# Patient Record
Sex: Female | Born: 1950 | Race: White | Hispanic: No | Marital: Married | Smoking: Never smoker
Health system: Southern US, Community
[De-identification: ages and names within clinical notes are randomized; demographics above are authoritative.]

## PROBLEM LIST (undated history)

## (undated) HISTORY — PX: TUBAL LIGATION: SHX77

## (undated) HISTORY — PX: TIBIA FRACTURE SURGERY: SHX806

---

## 2011-08-15 ENCOUNTER — Inpatient Hospital Stay: Payer: Self-pay | Admitting: Specialist

## 2011-08-15 LAB — CBC
HCT: 41.6 % (ref 35.0–47.0)
HGB: 14.7 g/dL (ref 12.0–16.0)
MCHC: 35.4 g/dL (ref 32.0–36.0)
MCV: 89 fL (ref 80–100)
RBC: 4.67 10*6/uL (ref 3.80–5.20)
RDW: 12.2 % (ref 11.5–14.5)
WBC: 10.8 10*3/uL (ref 3.6–11.0)

## 2011-08-15 LAB — COMPREHENSIVE METABOLIC PANEL
Alkaline Phosphatase: 48 U/L — ABNORMAL LOW (ref 50–136)
BUN: 17 mg/dL (ref 7–18)
Bilirubin,Total: 0.8 mg/dL (ref 0.2–1.0)
Chloride: 107 mmol/L (ref 98–107)
EGFR (African American): >60
Glucose: 132 mg/dL — ABNORMAL HIGH (ref 65–99)
Potassium: 4 mmol/L (ref 3.5–5.1)
SGOT(AST): 24 U/L (ref 15–37)
SGPT (ALT): 28 U/L
Sodium: 143 mmol/L (ref 136–145)
Total Protein: 6.8 g/dL (ref 6.4–8.2)

## 2011-08-15 LAB — PROTIME-INR: Prothrombin Time: 11.9 secs (ref 11.5–14.7)

## 2013-07-22 IMAGING — CR PELVIS - 1-2 VIEW
1 series · 1 of 1 positions shown · non-contrast
Comparison: none

REASON FOR EXAM: pain following trauma
COMMENTS:

[t pelvis ap]
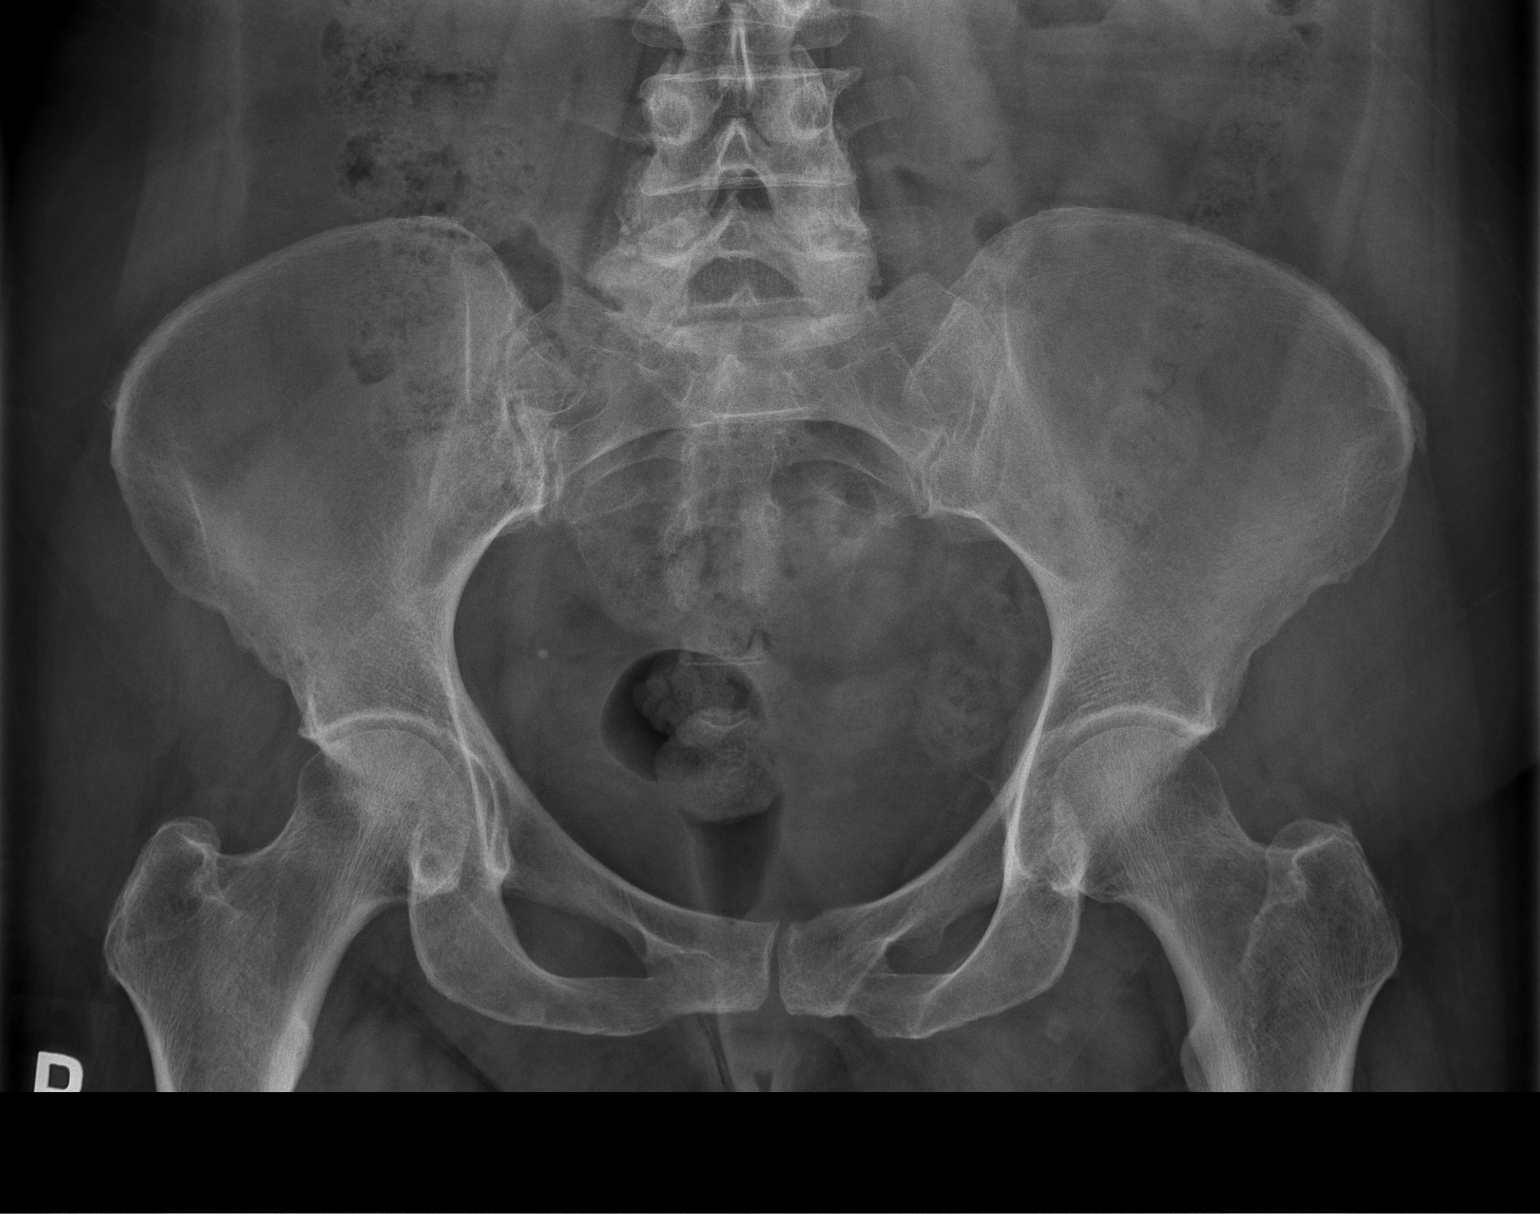

[1 of 1 positions shown; findings below may reference images not displayed]

PROCEDURE:     DXR - DXR PELVIS AP ONLY  - August 15, 2011 [DATE]

RESULT:

An AP view of the bony pelvis shows no fracture or other significant osseous
abnormality. No lytic or blastic lesions are seen. The hip joint spaces
bilaterally are symmetrical. The sacroiliac joints are normal in appearance.
IMPRESSION: No acute changes are identified.

## 2013-07-22 IMAGING — CR DG CHEST 1V
1 series · 1 of 1 positions shown · non-contrast
Comparison: none

REASON FOR EXAM: pain following trauma
COMMENTS:

PROCEDURE:     DXR - DXR CHEST 1 VIEWAP OR PA  - August 15, 2011 [DATE]
RESULT:
The lung fields are clear. The heart, mediastinal and osseous structures
reveal no significant abnormalities.

[t chest supine]
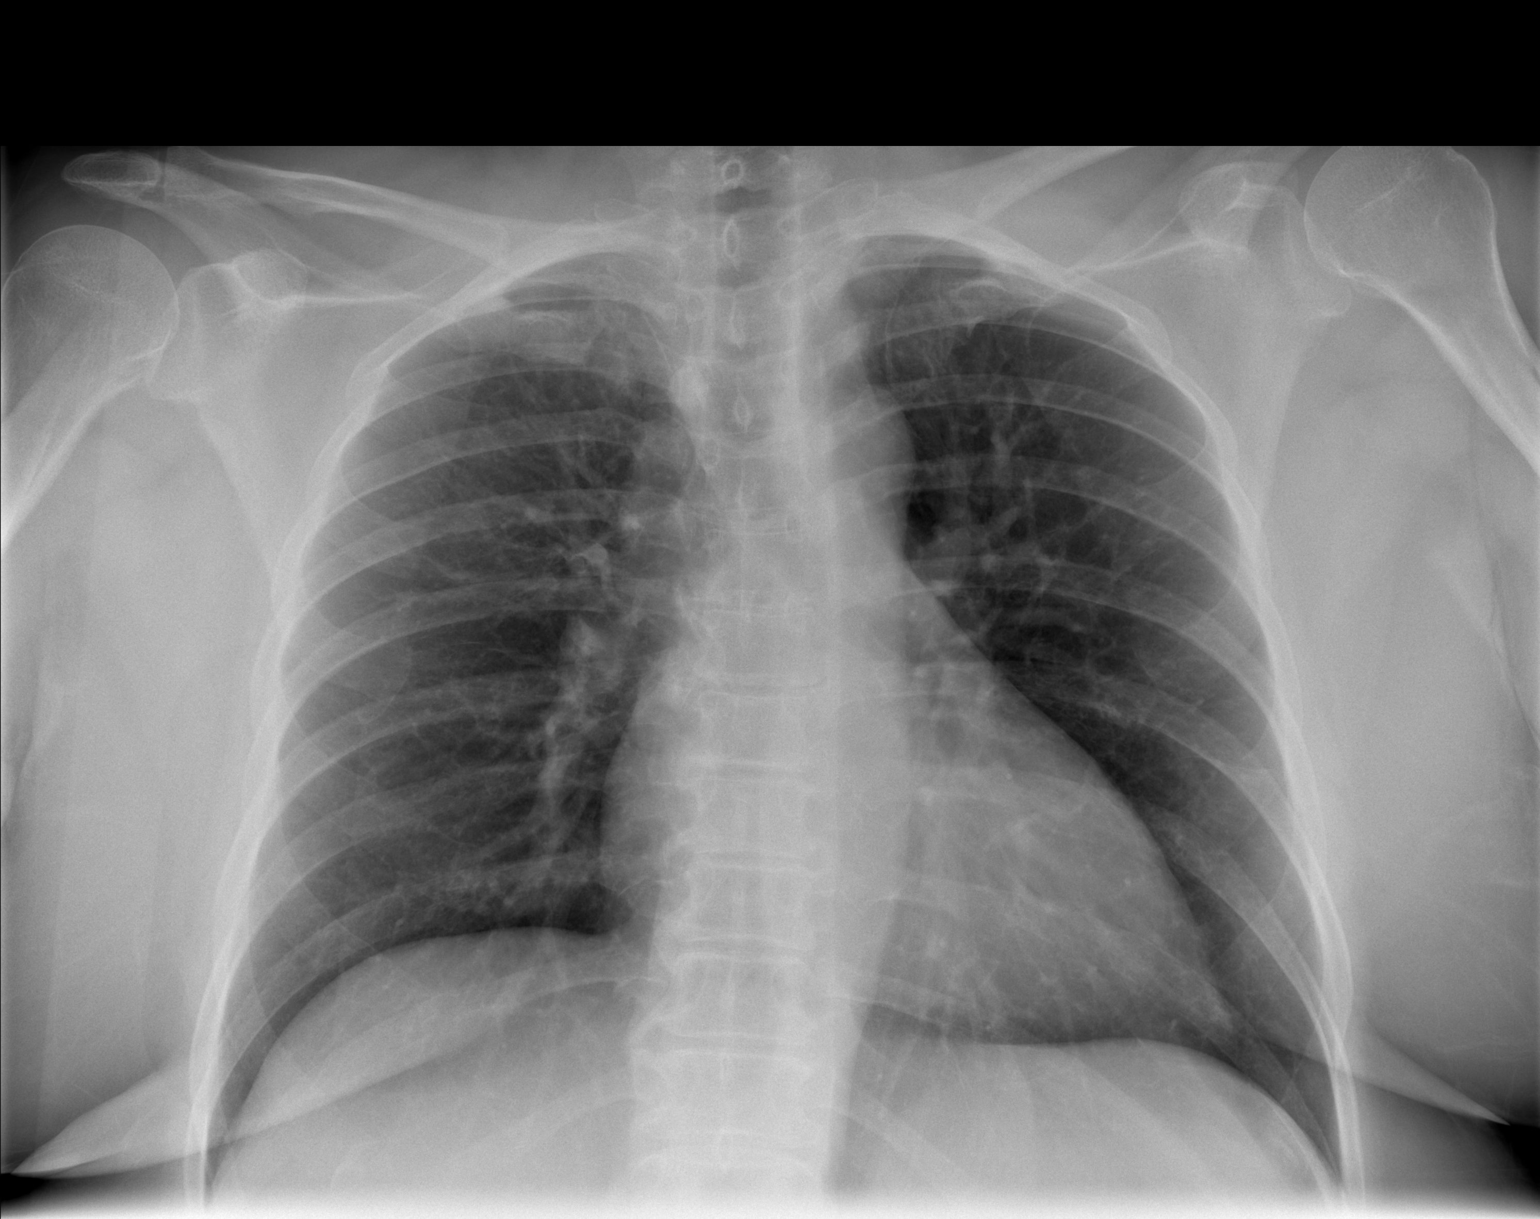

[1 of 1 positions shown; findings below may reference images not displayed]

IMPRESSION: No acute changes are identified.

## 2014-12-03 NOTE — Op Note (Signed)
PATIENT NAME:  Jodi LaineHOMPSON, Laporsha MR#:  [id number] DATE OF BIRTH:  April 19, 1951  DATE OF PROCEDURE:  08/16/2011  PREOPERATIVE DIAGNOSIS: Closed left tibial shaft fracture.   POSTOPERATIVE DIAGNOSIS: Closed left tibial shaft fracture.   PROCEDURE: Intramedullary rodding left tibial shaft fracture.   SURGEON: Clare Gandyhristopher E. Remmy Riffe, MD  ANESTHESIA: Spinal.   COMPLICATIONS: None.   ESTIMATED BLOOD LOSS: 100 mL.   DESCRIPTION OF PROCEDURE: After adequate induction of spinal anesthesia, 1 gram of Ancef was given intravenously. The patient was positioned in the supine position on the Operating Room table. The left lower extremity was completely prepped out from the toes to the groin and draped in standard sterile fashion. No tourniquet was used. The tibial traction device was placed beneath the patient's left knee and the foot was secured in the foot device using sterile Coban. Rotationally the tibia was adjusted to the rotation of the opposite side. Standard longitudinal incision is made over the medial aspect of the patellar tendon and the dissection carried down to the tibia. Guidepin is inserted into the proximal tibia anteriorly just medial to the patellar tendon. The reamer was used over this and then a ball-tipped guidepin was inserted and passed down to the ankle joint and reduction and position of the pin are confirmed in the AP and lateral views using the FluoroScan. An 8 mm 30 cm DePuy Versa nail was then passed over the guidepin and impacted all the way down to the tip. The reduction and the position of the rod was checked in the AP and lateral views and was seen to be satisfactory. Guide pin is removed. The proximal screw is inserted obliquely from the medial side using the guide and this is a 5.5 mm screw. Distally the rod is locked freehand using a 4.5 cortical screw. The position of the fracture, the rod and the interlocking screws is checked finally with the FluoroScan and is seen to be  satisfactory. The wounds were thoroughly irrigated multiple times with normal saline. Subcutaneous tissue is closed at the knee with 3-0 Vicryl and the skin is closed with the skin stapler. The interlocking screw portals are closed with the skin stapler. Soft bulky dressing is applied along with a sugar tong type splint and the patient is returned to recovery room in satisfactory condition having tolerated the procedure quite well.   ____________________________ Clare Gandyhristopher E. Natane Heward, MD ces:cms D: 08/16/2011 13:18:32 ET T: 08/16/2011 14:31:31 ET JOB#: [id number]  cc: Clare Gandyhristopher E. Cathlene Gardella, MD, <Dictator> Clare GandyHRISTOPHER E Areil Ottey MD ELECTRONICALLY SIGNED 08/26/2011 13:33

## 2014-12-03 NOTE — Discharge Summary (Signed)
PATIENT NAME:  Jodi Osborn, Jodi Osborn MR#:  [id number] DATE OF BIRTH:  07/29/1951  DATE OF ADMISSION:  08/15/2011 DATE OF DISCHARGE:  08/18/2011  DISCHARGE DIAGNOSIS: Displaced midshaft left tibia fracture.   OPERATIONS/PROCEDURES PERFORMED: Intramedullary nailing left tibial shaft fracture on 08/16/2011.   PHYSICAL EXAMINATION: As written on admission.   LABORATORY DATA: As noted in the chart.   HOSPITAL COURSE: The patient was admitted in a posterior splint and her neurovascular examination remained intact. Her lab data is within normal limits and she was fully healthy. On 08/16/2011, she was taken to the operating room where intramedullary nailing of her left tibia fracture was performed without difficulty. Postoperatively she had a benign course. She was advanced up into the chair with physical therapy using the walker, toe-touch weightbearing. By 08/18/2011, she was doing quite well and she was discharged home with home health physical therapy.   DISCHARGE INSTRUCTIONS: 1. She is to resume taking any of her previous medications.  2. She is to take Vicodin 1 tablet every three hours as necessary for pain.  3. She is to keep her leg elevated as much as possible at all times.  4. She is to take aspirin 1 tablet per day as prophylaxis for venous thrombosis and pulmonary embolism.  5. She is to return to the office to see Dr. Katrinka BlazingSmith on 08/24/2011 at 10:30 a.m. for x-ray and physical examination.   ____________________________ Clare Gandyhristopher E. Jyla Hopf, MD ces:drc D: 09/18/2011 13:53:28 ET T: 09/18/2011 14:01:56 ET JOB#: [id number]  cc: Clare Gandyhristopher E. Gyanna Jarema, MD, <Dictator> Clare GandyHRISTOPHER E Adda Stokes MD ELECTRONICALLY SIGNED 09/19/2011 9:10

## 2019-08-12 HISTORY — PX: COLONOSCOPY: SHX174

## 2021-01-17 ENCOUNTER — Encounter: Payer: Self-pay | Admitting: Ophthalmology

## 2021-01-30 ENCOUNTER — Ambulatory Visit: Payer: Medicare PPO | Admitting: Anesthesiology

## 2021-01-30 ENCOUNTER — Encounter: Payer: Self-pay | Admitting: Ophthalmology

## 2021-01-30 ENCOUNTER — Other Ambulatory Visit: Payer: Self-pay

## 2021-01-30 ENCOUNTER — Ambulatory Visit
Admission: RE | Admit: 2021-01-30 | Discharge: 2021-01-30 | Disposition: A | Discharge status: Home / Self Care | Payer: Medicare PPO | Attending: Ophthalmology | Admitting: Ophthalmology

## 2021-01-30 ENCOUNTER — Encounter
Admission: RE | Disposition: A | Discharge status: Home / Self Care | Payer: Self-pay | Source: Home / Self Care | Attending: Ophthalmology

## 2021-01-30 DIAGNOSIS — H2512 Age-related nuclear cataract, left eye: Secondary | ICD-10-CM | POA: Diagnosis present

## 2021-01-30 HISTORY — PX: CATARACT EXTRACTION W/PHACO: SHX586

## 2021-01-30 SURGERY — PHACOEMULSIFICATION, CATARACT, WITH IOL INSERTION
Anesthesia: Monitor Anesthesia Care | Site: Eye | Laterality: Left

## 2021-01-30 MED ORDER — MIDAZOLAM HCL 2 MG/2ML IJ SOLN
INTRAMUSCULAR | Status: DC | PRN
  Administered 2021-01-30: 1 mg via INTRAVENOUS

## 2021-01-30 MED ORDER — EPINEPHRINE PF 1 MG/ML IJ SOLN
INTRAOCULAR | Status: DC | PRN
  Administered 2021-01-30: 60 mL via OPHTHALMIC

## 2021-01-30 MED ORDER — LACTATED RINGERS IV SOLN: INTRAVENOUS | Status: DC

## 2021-01-30 MED ORDER — NA HYALUR & NA CHOND-NA HYALUR 0.4-0.35 ML IO KIT
PACK | INTRAOCULAR | Status: DC | PRN
  Administered 2021-01-30: 1 mL via INTRAOCULAR

## 2021-01-30 MED ORDER — FENTANYL CITRATE (PF) 100 MCG/2ML IJ SOLN
INTRAMUSCULAR | Status: DC | PRN
  Administered 2021-01-30: 50 ug via INTRAVENOUS

## 2021-01-30 MED ORDER — CYCLOPENTOLATE HCL 2 % OP SOLN
1.0000 [drp] | OPHTHALMIC | Status: DC | PRN
  Administered 2021-01-30 (×3): 1 [drp] via OPHTHALMIC

## 2021-01-30 MED ORDER — CEFUROXIME OPHTHALMIC INJECTION 1 MG/0.1 ML
INJECTION | OPHTHALMIC | Status: DC | PRN
  Administered 2021-01-30: 0.1 mL via OPHTHALMIC

## 2021-01-30 MED ORDER — BRIMONIDINE TARTRATE-TIMOLOL 0.2-0.5 % OP SOLN
OPHTHALMIC | Status: DC | PRN
  Administered 2021-01-30: 1 [drp] via OPHTHALMIC

## 2021-01-30 MED ORDER — ACETAMINOPHEN 325 MG PO TABS
325.0000 mg | ORAL_TABLET | ORAL | Status: DC | PRN
Start: 2021-01-30 — End: 2021-01-30

## 2021-01-30 MED ORDER — PHENYLEPHRINE HCL 10 % OP SOLN
1.0000 [drp] | OPHTHALMIC | Status: DC | PRN
  Administered 2021-01-30 (×3): 1 [drp] via OPHTHALMIC

## 2021-01-30 MED ORDER — ACETAMINOPHEN 160 MG/5ML PO SOLN: 325.0000 mg | ORAL | Status: DC | PRN

## 2021-01-30 MED ORDER — TETRACAINE HCL 0.5 % OP SOLN
1.0000 [drp] | OPHTHALMIC | Status: DC | PRN
  Administered 2021-01-30 (×3): 1 [drp] via OPHTHALMIC

## 2021-01-30 MED ORDER — LIDOCAINE HCL (PF) 2 % IJ SOLN
INTRAOCULAR | Status: DC | PRN
  Administered 2021-01-30: 1 mL via INTRAMUSCULAR

## 2021-01-30 SURGICAL SUPPLY — 23 items
CANNULA ANT/CHMB 27GA (MISCELLANEOUS) ×2 IMPLANT
GLOVE SURG ENC TEXT LTX SZ7.5 (GLOVE) ×2 IMPLANT
GLOVE SURG TRIUMPH 8.0 PF LTX (GLOVE) ×2 IMPLANT
GOWN STRL REUS W/ TWL LRG LVL3 (GOWN DISPOSABLE) ×2 IMPLANT
GOWN STRL REUS W/TWL LRG LVL3 (GOWN DISPOSABLE) ×4
LENS IOL EYHANCE TORIC II 22.5 ×2 IMPLANT
LENS IOL EYHANCE TRC 600 22.5 ×1 IMPLANT
LENS IOL EYHNC TORIC 600 22.5 ×1 IMPLANT
MARKER SKIN DUAL TIP RULER LAB (MISCELLANEOUS) ×2 IMPLANT
NDL RETROBULBAR .5 NSTRL (NEEDLE) IMPLANT
NEEDLE CAPSULORHEX 25GA (NEEDLE) ×2 IMPLANT
NEEDLE FILTER BLUNT 18X 1/2SAF (NEEDLE) ×2
NEEDLE FILTER BLUNT 18X1 1/2 (NEEDLE) ×2 IMPLANT
PACK EYE AFTER SURG (MISCELLANEOUS) ×2 IMPLANT
RING MALYGIN 7.0 (MISCELLANEOUS) IMPLANT
SUT ETHILON 10-0 CS-B-6CS-B-6 (SUTURE)
SUT VICRYL  9 0 (SUTURE)
SUT VICRYL 9 0 (SUTURE) IMPLANT
SUTURE EHLN 10-0 CS-B-6CS-B-6 (SUTURE) IMPLANT
SYR 3ML LL SCALE MARK (SYRINGE) ×4 IMPLANT
SYR TB 1ML LUER SLIP (SYRINGE) ×2 IMPLANT
WATER STERILE IRR 250ML POUR (IV SOLUTION) ×2 IMPLANT
WIPE NON LINTING 3.25X3.25 (MISCELLANEOUS) ×2 IMPLANT

## 2021-01-30 NOTE — H&P (Signed)
  [facility]   Primary Care Physician:  Nicolasa Ducking, MD Ophthalmologist: Dr. Lockie Mola  Pre-Procedure History & Physical: HPI:  Jodi Osborn is a 70 y.o. female here for ophthalmic surgery.   History reviewed. No pertinent past medical history.  Past Surgical History:  Procedure Laterality Date   COLONOSCOPY  2021   TIBIA FRACTURE SURGERY     TUBAL LIGATION      Prior to Admission medications   Medication Sig Start Date End Date Taking? Authorizing Provider  Ascorbic Acid (VITAMIN C PO) Take by mouth.   Yes [provider]  Cholecalciferol (D3 VITAMIN PO) Take by mouth.   Yes [provider]  LYSINE PO Take by mouth.   Yes [provider]  Multiple Vitamin (MULTIVITAMIN) tablet Take 1 tablet by mouth daily.   Yes [provider]  Omega-3 Fatty Acids (FISH OIL PO) Take by mouth.   Yes [provider]    Allergies as of 01/02/2021   (Not on File)    History reviewed. No pertinent family history.  Social History   Socioeconomic History   Marital status: Married    Spouse name: Not on file   Number of children: Not on file   Years of education: Not on file   Highest education level: Not on file  Occupational History   Not on file  Tobacco Use   Smoking status: Never   Smokeless tobacco: Not on file  Substance and Sexual Activity   Alcohol use: Not on file   Drug use: Not on file   Sexual activity: Not on file  Other Topics Concern   Not on file  Social History Narrative   Not on file   Social Determinants of Health   Financial Resource Strain: Not on file  Food Insecurity: Not on file  Transportation Needs: Not on file  Physical Activity: Not on file  Stress: Not on file  Social Connections: Not on file  Intimate Partner Violence: Not on file    Review of Systems: See HPI, otherwise negative ROS  Physical Exam: BP (!) 176/85   Pulse 77   Temp 98.1 F (36.7 C) (Temporal)   Resp  20   Ht 5\' 4"  (1.626 m)   Wt 86.5 kg   SpO2 97%   BMI 32.72 kg/m  General:   Alert,  pleasant and cooperative in NAD Head:  Normocephalic and atraumatic. Lungs:  Clear to auscultation.    Heart:  Regular rate and rhythm.   Impression/Plan: Jodi Osborn is here for ophthalmic surgery.  Risks, benefits, limitations, and alternatives regarding ophthalmic surgery have been reviewed with the patient.  Questions have been answered.  All parties agreeable.   [name], MD  01/30/2021, 7:37 AM

## 2021-01-30 NOTE — Op Note (Signed)
LOCATION:  [facility]   PREOPERATIVE DIAGNOSIS:  Nuclear sclerotic cataract of the left eye.  H25.12  POSTOPERATIVE DIAGNOSIS:  Nuclear sclerotic cataract of the left eye.   PROCEDURE:  Phacoemulsification with Toric posterior chamber intraocular lens placement of the left eye.  Ultrasound time: Procedure(s) with comments: CATARACT EXTRACTION PHACO AND INTRAOCULAR LENS PLACEMENT (IOC) LEFT eyehance toric lens (Left) - 5.46 00:52.4 LENS:ORIMPLANT  [id number] 22.5 D Eyhance Toric intraocular lens with 6.0 diopters of cylindrical power with axis orientation at 85 degrees.    SURGEON:  Deirdre Evener, MD   ANESTHESIA:  Topical with tetracaine drops and 2% Xylocaine jelly, augmented with 1% preservative-free intracameral lidocaine.  COMPLICATIONS:  None.   DESCRIPTION OF PROCEDURE:  The patient was identified in the holding room and transported to the operating suite and placed in the supine position under the operating microscope.  The left eye was identified as the operative eye, and it was prepped and draped in the usual sterile ophthalmic fashion.    A clear-corneal paracentesis incision was made at the 1:30 position.  0.5 ml of preservative-free 1% lidocaine was injected into the anterior chamber. The anterior chamber was filled with Viscoat.  A 2.4 millimeter near clear corneal incision was then made at the 10:30 position.  A cystotome and capsulorrhexis forceps were then used to make a curvilinear capsulorrhexis.  Hydrodissection and hydrodelineation were then performed using balanced salt solution.   Phacoemulsification was then used in stop and chop fashion to remove the lens, nucleus and epinucleus.  The remaining cortex was aspirated using the irrigation and aspiration handpiece.  Provisc viscoelastic was then placed into the capsular bag to distend it for lens placement.  The Verion digital marker was used to align the implant at the intended axis.   A 22.5 diopter  lens was then injected into the capsular bag.  It was rotated clockwise until the axis marks on the lens were approximately 15 degrees in the counterclockwise direction to the intended alignment.  The viscoelastic was aspirated from the eye using the irrigation aspiration handpiece.  Then, a Koch spatula through the sideport incision was used to rotate the lens in a clockwise direction until the axis markings of the intraocular lens were lined up with the Verion alignment.  Balanced salt solution was then used to hydrate the wounds. Cefuroxime 0.1 ml of a 10mg /ml solution was injected into the anterior chamber for a dose of 1 mg of intracameral antibiotic at the completion of the case.    The eye was noted to have a physiologic pressure and there was no wound leak noted.   Timolol and Brimonidine drops were applied to the eye.  The patient was taken to the recovery room in stable condition having had no complications of anesthesia or surgery.  [name] 01/30/2021, 8:47 AM

## 2021-01-30 NOTE — Transfer of Care (Signed)
Immediate Anesthesia Transfer of Care Note  Patient: Jodi Osborn  Procedure(s) Performed: CATARACT EXTRACTION PHACO AND INTRAOCULAR LENS PLACEMENT (IOC) LEFT eyehance toric lens (Left: Eye)  Patient Location: PACU  Anesthesia Type: MAC  Level of Consciousness: awake, alert  and patient cooperative  Airway and Oxygen Therapy: Patient Spontanous Breathing and Patient connected to supplemental oxygen  Post-op Assessment: Post-op Vital signs reviewed, Patient's Cardiovascular Status Stable, Respiratory Function Stable, Patent Airway and No signs of Nausea or vomiting  Post-op Vital Signs: Reviewed and stable  Complications: No notable events documented.

## 2021-01-30 NOTE — Anesthesia Procedure Notes (Signed)
Procedure Name: MAC Date/Time: 01/30/2021 8:24 AM Performed by: Dionne Bucy, CRNA Pre-anesthesia Checklist: Patient identified, Emergency Drugs available, Suction available, Patient being monitored and Timeout performed Patient Re-evaluated:Patient Re-evaluated prior to induction Oxygen Delivery Method: Nasal cannula Placement Confirmation: positive ETCO2

## 2021-01-30 NOTE — Anesthesia Preprocedure Evaluation (Signed)
Anesthesia Evaluation  °Patient identified by MRN, date of birth, ID band °Patient awake ° ° ° °Reviewed: °Allergy & Precautions, H&P , NPO status , Patient's Chart, lab work & pertinent test results, reviewed documented beta blocker date and time  ° °Airway °Mallampati: II ° °TM Distance: >3 FB °Neck ROM: full ° ° ° Dental °no notable dental hx. ° °  °Pulmonary °neg pulmonary ROS,  °  °Pulmonary exam normal °breath sounds clear to auscultation ° ° ° ° ° ° Cardiovascular °Exercise Tolerance: Good °negative cardio ROS °Normal cardiovascular exam °Rhythm:regular Rate:Normal ° ° °  °Neuro/Psych °negative neurological ROS ° negative psych ROS  ° GI/Hepatic °negative GI ROS, Neg liver ROS,   °Endo/Other  °negative endocrine ROS ° Renal/GU °negative Renal ROS  °negative genitourinary °  °Musculoskeletal ° ° Abdominal °  °Peds ° Hematology °negative hematology ROS °(+)   °Anesthesia Other Findings ° ° Reproductive/Obstetrics °negative OB ROS ° °  ° ° ° ° ° ° ° ° ° ° ° ° ° °  °  ° ° ° ° ° ° ° ° °Anesthesia Physical °Anesthesia Plan ° °ASA: 1 ° °Anesthesia Plan: MAC  ° °Post-op Pain Management:   ° °Induction:  ° °PONV Risk Score and Plan:  ° °Airway Management Planned:  ° °Additional Equipment:  ° °Intra-op Plan:  ° °Post-operative Plan:  ° °Informed Consent: I have reviewed the patients History and Physical, chart, labs and discussed the procedure including the risks, benefits and alternatives for the proposed anesthesia with the patient or authorized representative who has indicated his/her understanding and acceptance.  ° ° ° °Dental Advisory Given ° °Plan Discussed with: CRNA and Anesthesiologist ° °Anesthesia Plan Comments:   ° ° ° ° ° ° °Anesthesia Quick Evaluation ° °

## 2021-01-30 NOTE — Anesthesia Postprocedure Evaluation (Signed)
Anesthesia Post Note  Patient: Jodi Osborn  Procedure(s) Performed: CATARACT EXTRACTION PHACO AND INTRAOCULAR LENS PLACEMENT (IOC) LEFT eyehance toric lens (Left: Eye)     Patient location during evaluation: PACU Anesthesia Type: MAC Level of consciousness: awake and alert Pain management: pain level controlled Vital Signs Assessment: post-procedure vital signs reviewed and stable Respiratory status: spontaneous breathing, nonlabored ventilation, respiratory function stable and patient connected to nasal cannula oxygen Cardiovascular status: stable and blood pressure returned to baseline Postop Assessment: no apparent nausea or vomiting Anesthetic complications: no   No notable events documented.  Trecia Rogers

## 2021-01-31 ENCOUNTER — Encounter: Payer: Self-pay | Admitting: Ophthalmology

## 2021-02-18 ENCOUNTER — Encounter: Payer: Self-pay | Admitting: Ophthalmology

## 2021-02-19 NOTE — Anesthesia Preprocedure Evaluation (Addendum)
Anesthesia Evaluation  Patient identified by MRN, date of birth, ID band Patient awake    Reviewed: Allergy & Precautions, NPO status , Patient's Chart, lab work & pertinent test results  History of Anesthesia Complications Negative for: history of anesthetic complications  Airway Mallampati: IV   Neck ROM: Full    Dental  (+) Caps   Pulmonary neg pulmonary ROS,    Pulmonary exam normal breath sounds clear to auscultation       Cardiovascular Exercise Tolerance: Good negative cardio ROS Normal cardiovascular exam Rhythm:Regular Rate:Normal     Neuro/Psych negative neurological ROS     GI/Hepatic negative GI ROS,   Endo/Other  Obesity   Renal/GU negative Renal ROS     Musculoskeletal   Abdominal   Peds  Hematology negative hematology ROS (+)   Anesthesia Other Findings   Reproductive/Obstetrics                            Anesthesia Physical Anesthesia Plan  ASA: 2  Anesthesia Plan: MAC   Post-op Pain Management:    Induction: Intravenous  PONV Risk Score and Plan: 2 and TIVA, Midazolam and Treatment may vary due to age or medical condition  Airway Management Planned: Nasal Cannula  Additional Equipment:   Intra-op Plan:   Post-operative Plan:   Informed Consent: I have reviewed the patients History and Physical, chart, labs and discussed the procedure including the risks, benefits and alternatives for the proposed anesthesia with the patient or authorized representative who has indicated his/her understanding and acceptance.       Plan Discussed with: CRNA  Anesthesia Plan Comments:        Anesthesia Quick Evaluation

## 2021-02-20 ENCOUNTER — Ambulatory Visit
Admission: RE | Admit: 2021-02-20 | Discharge: 2021-02-20 | Disposition: A | Discharge status: Home / Self Care | Payer: Medicare PPO | Attending: Ophthalmology | Admitting: Ophthalmology

## 2021-02-20 ENCOUNTER — Ambulatory Visit: Payer: Medicare PPO | Admitting: Anesthesiology

## 2021-02-20 ENCOUNTER — Encounter
Admission: RE | Disposition: A | Discharge status: Home / Self Care | Payer: Self-pay | Source: Home / Self Care | Attending: Ophthalmology

## 2021-02-20 ENCOUNTER — Other Ambulatory Visit: Payer: Self-pay

## 2021-02-20 ENCOUNTER — Encounter: Payer: Self-pay | Admitting: Ophthalmology

## 2021-02-20 DIAGNOSIS — H2511 Age-related nuclear cataract, right eye: Secondary | ICD-10-CM | POA: Diagnosis not present

## 2021-02-20 HISTORY — PX: CATARACT EXTRACTION W/PHACO: SHX586

## 2021-02-20 SURGERY — PHACOEMULSIFICATION, CATARACT, WITH IOL INSERTION
Anesthesia: Monitor Anesthesia Care | Site: Eye | Laterality: Right

## 2021-02-20 MED ORDER — LACTATED RINGERS IV SOLN: INTRAVENOUS | Status: DC

## 2021-02-20 MED ORDER — PHENYLEPHRINE HCL 10 % OP SOLN
1.0000 [drp] | OPHTHALMIC | Status: DC | PRN
  Administered 2021-02-20 (×3): 1 [drp] via OPHTHALMIC

## 2021-02-20 MED ORDER — MIDAZOLAM HCL 2 MG/2ML IJ SOLN
INTRAMUSCULAR | Status: DC | PRN
  Administered 2021-02-20: 2 mg via INTRAVENOUS

## 2021-02-20 MED ORDER — LIDOCAINE HCL (PF) 2 % IJ SOLN
INTRAOCULAR | Status: DC | PRN
  Administered 2021-02-20: 1 mL

## 2021-02-20 MED ORDER — CEFUROXIME OPHTHALMIC INJECTION 1 MG/0.1 ML
INJECTION | OPHTHALMIC | Status: DC | PRN
  Administered 2021-02-20: 0.1 mL via INTRACAMERAL

## 2021-02-20 MED ORDER — SIGHTPATH DOSE#1 NA HYALUR & NA CHOND-NA HYALUR IO KIT
PACK | INTRAOCULAR | Status: DC | PRN
  Administered 2021-02-20: 1 mL via OPHTHALMIC

## 2021-02-20 MED ORDER — BRIMONIDINE TARTRATE-TIMOLOL 0.2-0.5 % OP SOLN
OPHTHALMIC | Status: DC | PRN
  Administered 2021-02-20: 1 [drp] via OPHTHALMIC

## 2021-02-20 MED ORDER — SIGHTPATH DOSE#1 BSS IO SOLN
INTRAOCULAR | Status: DC | PRN
  Administered 2021-02-20: 83 mL via OPHTHALMIC

## 2021-02-20 MED ORDER — SIGHTPATH DOSE#1 BSS IO SOLN
INTRAOCULAR | Status: DC | PRN
  Administered 2021-02-20: 15 mL via INTRAOCULAR

## 2021-02-20 MED ORDER — TETRACAINE HCL 0.5 % OP SOLN
1.0000 [drp] | OPHTHALMIC | Status: DC | PRN
  Administered 2021-02-20 (×3): 1 [drp] via OPHTHALMIC

## 2021-02-20 MED ORDER — FENTANYL CITRATE (PF) 100 MCG/2ML IJ SOLN
INTRAMUSCULAR | Status: DC | PRN
  Administered 2021-02-20: 50 ug via INTRAVENOUS

## 2021-02-20 MED ORDER — CYCLOPENTOLATE HCL 2 % OP SOLN
1.0000 [drp] | OPHTHALMIC | Status: DC | PRN
  Administered 2021-02-20 (×3): 1 [drp] via OPHTHALMIC

## 2021-02-20 SURGICAL SUPPLY — 24 items
CANNULA ANT/CHMB 27GA (MISCELLANEOUS) ×2 IMPLANT
GLOVE SURG ENC TEXT LTX SZ7.5 (GLOVE) ×4 IMPLANT
GLOVE SURG GAMMEX PI TX LF 7.5 (GLOVE) IMPLANT
GLOVE SURG TRIUMPH 8.0 PF LTX (GLOVE) ×2 IMPLANT
GOWN STRL REUS W/ TWL LRG LVL3 (GOWN DISPOSABLE) ×2 IMPLANT
GOWN STRL REUS W/TWL LRG LVL3 (GOWN DISPOSABLE) ×2
LENS IOL EYHANCE TORIC II 18.5 ×1 IMPLANT
LENS IOL EYHANCE TRC 300 18.5 ×1 IMPLANT
LENS IOL EYHNC TORIC 300 18.5 ×1 IMPLANT
MARKER SKIN DUAL TIP RULER LAB (MISCELLANEOUS) ×2 IMPLANT
NDL RETROBULBAR .5 NSTRL (NEEDLE) IMPLANT
NEEDLE CAPSULORHEX 25GA (NEEDLE) ×2 IMPLANT
NEEDLE FILTER BLUNT 18X 1/2SAF (NEEDLE) ×2
NEEDLE FILTER BLUNT 18X1 1/2 (NEEDLE) ×2 IMPLANT
PACK EYE AFTER SURG (MISCELLANEOUS) ×2 IMPLANT
RING MALYGIN 7.0 (MISCELLANEOUS) IMPLANT
SUT ETHILON 10-0 CS-B-6CS-B-6 (SUTURE)
SUT VICRYL  9 0 (SUTURE)
SUT VICRYL 9 0 (SUTURE) IMPLANT
SUTURE EHLN 10-0 CS-B-6CS-B-6 (SUTURE) IMPLANT
SYR 3ML LL SCALE MARK (SYRINGE) ×4 IMPLANT
SYR TB 1ML LUER SLIP (SYRINGE) ×2 IMPLANT
WATER STERILE IRR 250ML POUR (IV SOLUTION) ×2 IMPLANT
WIPE NON LINTING 3.25X3.25 (MISCELLANEOUS) ×2 IMPLANT

## 2021-02-20 NOTE — Anesthesia Procedure Notes (Signed)
Procedure Name: MAC Date/Time: 02/20/2021 11:33 AM Performed by: Jeannene Patella, CRNA Pre-anesthesia Checklist: Patient identified, Emergency Drugs available, Suction available, Timeout performed and Patient being monitored Patient Re-evaluated:Patient Re-evaluated prior to induction Oxygen Delivery Method: Nasal cannula Placement Confirmation: positive ETCO2

## 2021-02-20 NOTE — Transfer of Care (Signed)
Immediate Anesthesia Transfer of Care Note  Patient: Jodi Osborn  Procedure(s) Performed: CATARACT EXTRACTION PHACO AND INTRAOCULAR LENS PLACEMENT (IOC) RIGHT EYEHANCE TORIC LENS (Right: Eye)  Patient Location: PACU  Anesthesia Type: MAC  Level of Consciousness: awake, alert  and patient cooperative  Airway and Oxygen Therapy: Patient Spontanous Breathing and Patient connected to supplemental oxygen  Post-op Assessment: Post-op Vital signs reviewed, Patient's Cardiovascular Status Stable, Respiratory Function Stable, Patent Airway and No signs of Nausea or vomiting  Post-op Vital Signs: Reviewed and stable  Complications: No notable events documented.

## 2021-02-20 NOTE — H&P (Signed)
[facility]   Primary Care Physician:  Nicolasa Ducking, MD Ophthalmologist: Dr. Lockie Mola  Pre-Procedure History & Physical: HPI:  Jodi Osborn is a 70 y.o. female here for ophthalmic surgery.   History reviewed. No pertinent past medical history.  Past Surgical History:  Procedure Laterality Date   CATARACT EXTRACTION W/PHACO Left 01/30/2021   Procedure: CATARACT EXTRACTION PHACO AND INTRAOCULAR LENS PLACEMENT (IOC) LEFT eyehance toric lens;  Surgeon: Lockie Mola, MD;  Location: [facility];  Service: Ophthalmology;  Laterality: Left;  5.46 00:52.4   COLONOSCOPY  2021   TIBIA FRACTURE SURGERY     TUBAL LIGATION      Prior to Admission medications   Medication Sig Start Date End Date Taking? Authorizing Provider  Ascorbic Acid (VITAMIN C PO) Take by mouth.   Yes [provider]  Cholecalciferol (D3 VITAMIN PO) Take by mouth.   Yes [provider]  LYSINE PO Take by mouth.   Yes [provider]  Multiple Vitamin (MULTIVITAMIN) tablet Take 1 tablet by mouth daily.   Yes [provider]  Omega-3 Fatty Acids (FISH OIL PO) Take by mouth.   Yes [provider]    Allergies as of 01/02/2021   (Not on File)    History reviewed. No pertinent family history.  Social History   Socioeconomic History   Marital status: Married    Spouse name: Not on file   Number of children: Not on file   Years of education: Not on file   Highest education level: Not on file  Occupational History   Not on file  Tobacco Use   Smoking status: Never   Smokeless tobacco: Not on file  Substance and Sexual Activity   Alcohol use: Not on file   Drug use: Not on file   Sexual activity: Not on file  Other Topics Concern   Not on file  Social History Narrative   Not on file   Social Determinants of Health   Financial Resource Strain: Not on file  Food Insecurity: Not on file  Transportation Needs: Not on file   Physical Activity: Not on file  Stress: Not on file  Social Connections: Not on file  Intimate Partner Violence: Not on file    Review of Systems: See HPI, otherwise negative ROS  Physical Exam: BP (!) 164/96   Pulse 77   Temp (!) 97.5 F (36.4 C) (Temporal)   Resp 16   Ht 5\' 4"  (1.626 m)   Wt 85.7 kg   SpO2 98%   BMI 32.44 kg/m  General:   Alert,  pleasant and cooperative in NAD Head:  Normocephalic and atraumatic. Lungs:  Clear to auscultation.    Heart:  Regular rate and rhythm.   Impression/Plan: Jodi Osborn is here for ophthalmic surgery.  Risks, benefits, limitations, and alternatives regarding ophthalmic surgery have been reviewed with the patient.  Questions have been answered.  All parties agreeable.   [name], MD  02/20/2021, 10:31 AM

## 2021-02-20 NOTE — Anesthesia Postprocedure Evaluation (Signed)
Anesthesia Post Note  Patient: Jodi Osborn  Procedure(s) Performed: CATARACT EXTRACTION PHACO AND INTRAOCULAR LENS PLACEMENT (IOC) RIGHT EYEHANCE TORIC LENS (Right: Eye)     Patient location during evaluation: PACU Anesthesia Type: MAC Level of consciousness: awake and alert, oriented and patient cooperative Pain management: pain level controlled Vital Signs Assessment: post-procedure vital signs reviewed and stable Respiratory status: spontaneous breathing, nonlabored ventilation and respiratory function stable Cardiovascular status: blood pressure returned to baseline and stable Postop Assessment: adequate PO intake Anesthetic complications: no   No notable events documented.  Darrin Nipper

## 2021-02-20 NOTE — Op Note (Signed)
LOCATION:  [facility]   PREOPERATIVE DIAGNOSIS:  Nuclear sclerotic cataract of the right eye.  H25.11   POSTOPERATIVE DIAGNOSIS:  Nuclear sclerotic cataract of the right eye.   PROCEDURE:  Phacoemulsification with Toric posterior chamber intraocular lens placement of the right eye.  Ultrasound time: Procedure(s) with comments: CATARACT EXTRACTION PHACO AND INTRAOCULAR LENS PLACEMENT (IOC) RIGHT EYEHANCE TORIC LENS (Right) - 5.46 1:05.6  LENS:   Implant Name Type Inv. Item Serial No. Manufacturer Lot No. LRB No. Used Action  TECHNIS EYEHANCE TORIC II IOL  Intraocular Lens  [id number] [employer]  Right 1 Implanted     [id number] 18.5 Toric intraocular lens with 3.0 diopters of cylindrical power with axis orientation at 85 degrees.   SURGEON:  Deirdre Evener, MD   ANESTHESIA: Topical with tetracaine drops and 2% Xylocaine jelly, augmented with 1% preservative-free intracameral lidocaine. .   COMPLICATIONS:  None.   DESCRIPTION OF PROCEDURE:  The patient was identified in the holding room and transported to the operating suite and placed in the supine position under the operating microscope.  The right eye was identified as the operative eye, and it was prepped and draped in the usual sterile ophthalmic fashion.    A clear-corneal paracentesis incision was made at the 12:00 position.  0.5 ml of preservative-free 1% lidocaine was injected into the anterior chamber. The anterior chamber was filled with Viscoat.  A 2.4 millimeter near clear corneal incision was then made at the 9:00 position.  A cystotome and capsulorrhexis forceps were then used to make a curvilinear capsulorrhexis.  Hydrodissection and hydrodelineation were then performed using balanced salt solution.   Phacoemulsification was then used in stop and chop fashion to remove the lens, nucleus and epinucleus.  The remaining cortex was aspirated using the irrigation and aspiration handpiece.  Provisc  viscoelastic was then placed into the capsular bag to distend it for lens placement.  The Verion digital marker was used to align the implant at the intended axis.   A Toric lens was then injected into the capsular bag.  It was rotated clockwise until the axis marks on the lens were approximately 15 degrees in the counterclockwise direction to the intended alignment.  The viscoelastic was aspirated from the eye using the irrigation aspiration handpiece.  Then, a Koch spatula through the sideport incision was used to rotate the lens in a clockwise direction until the axis markings of the intraocular lens were lined up with the Verion alignment.  Balanced salt solution was then used to hydrate the wounds. Cefuroxime 0.1 ml of a 10mg /ml solution was injected into the anterior chamber for a dose of 1 mg of intracameral antibiotic at the completion of the case.    The eye was noted to have a physiologic pressure and there was no wound leak noted.   Timolol and Brimonidine drops were applied to the eye.  The patient was taken to the recovery room in stable condition having had no complications of anesthesia or surgery.  [name] 02/20/2021, 11:56 AM

## 2021-02-25 ENCOUNTER — Encounter: Payer: Self-pay | Admitting: Ophthalmology
